# Patient Record
Sex: Female | Born: 1960 | Race: White | Hispanic: No | Marital: Married
Health system: Midwestern US, Academic
[De-identification: ages and names within clinical notes are randomized; demographics above are authoritative.]

---

## 2016-09-19 ENCOUNTER — Ambulatory Visit: Admit: 2016-09-19 | Discharge: 2016-09-20 | Payer: BC Managed Care – HMO

## 2016-09-20 DIAGNOSIS — I472 Ventricular tachycardia: Principal | ICD-10-CM

## 2016-09-20 DIAGNOSIS — Z95810 Presence of automatic (implantable) cardiac defibrillator: ICD-10-CM

## 2016-11-10 LAB — COMPREHENSIVE METABOLIC PANEL
Lab: 0.7
Lab: 12
Lab: 19
Lab: 25
Lab: 3.5
Lab: 59
Lab: 68
Lab: 68
Lab: 7.2 — ABNORMAL HIGH (ref 11.5–14.5)
Lab: 9.7

## 2016-12-20 ENCOUNTER — Ambulatory Visit: Admit: 2016-12-19 | Discharge: 2016-12-20 | Payer: BC Managed Care – HMO

## 2016-12-20 DIAGNOSIS — I422 Other hypertrophic cardiomyopathy: Principal | ICD-10-CM

## 2016-12-20 DIAGNOSIS — I509 Heart failure, unspecified: Secondary | ICD-10-CM

## 2016-12-20 DIAGNOSIS — I472 Ventricular tachycardia: ICD-10-CM

## 2016-12-23 ENCOUNTER — Encounter: Admit: 2016-12-23 | Discharge: 2016-12-23 | Payer: BC Managed Care – HMO

## 2016-12-23 MED ORDER — FUROSEMIDE 20 MG PO TAB
40 mg | ORAL_TABLET | Freq: Two times a day (BID) | ORAL | 2 refills | 90.00000 days | Status: AC
Start: 2016-12-23 — End: 2017-04-22

## 2016-12-25 NOTE — Telephone Encounter
Reviewed with TLR, advised to increase lasix to 60 mg bid for 3 days, then resume lasix at 40 mg bid.  Called pt with recommendations and she verbalizes understanding.  Asked pt to call for increasing sx.  Pt is scheduled for f/u 10/18.

## 2017-01-02 NOTE — Telephone Encounter
LM on voicemail to follow-up on SOB.  Asked pt to call back if she continues to have sob.

## 2017-01-27 ENCOUNTER — Ambulatory Visit: Admit: 2017-01-27 | Discharge: 2017-01-28 | Payer: BC Managed Care – HMO

## 2017-01-27 ENCOUNTER — Encounter: Admit: 2017-01-27 | Discharge: 2017-01-27 | Payer: BC Managed Care – HMO

## 2017-01-27 DIAGNOSIS — I499 Cardiac arrhythmia, unspecified: ICD-10-CM

## 2017-01-27 DIAGNOSIS — G4730 Sleep apnea, unspecified: ICD-10-CM

## 2017-01-27 DIAGNOSIS — R198 Other specified symptoms and signs involving the digestive system and abdomen: ICD-10-CM

## 2017-01-27 DIAGNOSIS — IMO0002 Unspecified mental or behavioral problem: ICD-10-CM

## 2017-01-27 DIAGNOSIS — Z95810 Presence of automatic (implantable) cardiac defibrillator: ICD-10-CM

## 2017-01-27 DIAGNOSIS — I25118 Atherosclerotic heart disease of native coronary artery with other forms of angina pectoris: ICD-10-CM

## 2017-01-27 DIAGNOSIS — R0600 Dyspnea, unspecified: ICD-10-CM

## 2017-01-27 DIAGNOSIS — E669 Obesity, unspecified: ICD-10-CM

## 2017-01-27 DIAGNOSIS — Z9229 Personal history of other drug therapy: ICD-10-CM

## 2017-01-27 DIAGNOSIS — I509 Heart failure, unspecified: ICD-10-CM

## 2017-01-27 DIAGNOSIS — I429 Cardiomyopathy, unspecified: Principal | ICD-10-CM

## 2017-01-27 DIAGNOSIS — I2510 Atherosclerotic heart disease of native coronary artery without angina pectoris: ICD-10-CM

## 2017-01-27 DIAGNOSIS — I219 Acute myocardial infarction, unspecified: ICD-10-CM

## 2017-01-29 ENCOUNTER — Encounter: Admit: 2017-01-29 | Discharge: 2017-01-29 | Payer: BC Managed Care – HMO

## 2017-03-20 ENCOUNTER — Ambulatory Visit: Admit: 2017-03-20 | Discharge: 2017-03-21 | Payer: BC Managed Care – HMO

## 2017-03-21 DIAGNOSIS — I422 Other hypertrophic cardiomyopathy: Principal | ICD-10-CM

## 2017-03-21 DIAGNOSIS — Z95810 Presence of automatic (implantable) cardiac defibrillator: ICD-10-CM

## 2017-03-21 DIAGNOSIS — I472 Ventricular tachycardia: ICD-10-CM

## 2017-04-20 ENCOUNTER — Encounter: Admit: 2017-04-20 | Discharge: 2017-04-20 | Payer: BC Managed Care – HMO

## 2017-04-22 MED ORDER — FUROSEMIDE 20 MG PO TAB
ORAL_TABLET | Freq: Two times a day (BID) | ORAL | 2 refills | 90.00000 days | Status: AC
Start: 2017-04-22 — End: 2017-08-20

## 2017-05-16 ENCOUNTER — Encounter: Admit: 2017-05-16 | Discharge: 2017-05-16 | Payer: BC Managed Care – HMO

## 2017-05-18 ENCOUNTER — Encounter: Admit: 2017-05-18 | Discharge: 2017-05-18 | Payer: BC Managed Care – HMO

## 2017-05-18 ENCOUNTER — Ambulatory Visit: Admit: 2017-05-18 | Discharge: 2017-05-18 | Payer: BC Managed Care – HMO

## 2017-05-18 DIAGNOSIS — I509 Heart failure, unspecified: Principal | ICD-10-CM

## 2017-05-18 DIAGNOSIS — Z95810 Presence of automatic (implantable) cardiac defibrillator: ICD-10-CM

## 2017-05-18 DIAGNOSIS — E669 Obesity, unspecified: ICD-10-CM

## 2017-05-18 DIAGNOSIS — I429 Cardiomyopathy, unspecified: ICD-10-CM

## 2017-05-18 DIAGNOSIS — Z9229 Personal history of other drug therapy: ICD-10-CM

## 2017-05-18 DIAGNOSIS — IMO0002 Unspecified mental or behavioral problem: ICD-10-CM

## 2017-05-18 DIAGNOSIS — I499 Cardiac arrhythmia, unspecified: ICD-10-CM

## 2017-05-18 DIAGNOSIS — R198 Other specified symptoms and signs involving the digestive system and abdomen: ICD-10-CM

## 2017-05-18 DIAGNOSIS — I2510 Atherosclerotic heart disease of native coronary artery without angina pectoris: Secondary | ICD-10-CM

## 2017-05-18 DIAGNOSIS — I219 Acute myocardial infarction, unspecified: ICD-10-CM

## 2017-05-18 DIAGNOSIS — R0600 Dyspnea, unspecified: ICD-10-CM

## 2017-05-18 DIAGNOSIS — G4730 Sleep apnea, unspecified: ICD-10-CM

## 2017-05-18 MED ORDER — XARELTO 20 MG PO TAB
ORAL_TABLET | Freq: Every day | ORAL | 11 refills | 30.00000 days | Status: AC
Start: 2017-05-18 — End: 2018-04-28

## 2017-05-18 MED ORDER — MEXILETINE 150 MG PO CAP
ORAL_CAPSULE | Freq: Three times a day (TID) | 11 refills | Status: AC
Start: 2017-05-18 — End: 2018-04-28

## 2017-06-09 ENCOUNTER — Encounter: Admit: 2017-06-09 | Discharge: 2017-06-09 | Payer: BC Managed Care – HMO

## 2017-06-10 MED ORDER — KLOR-CON M10 10 MEQ PO TBTQ
ORAL_TABLET | Freq: Every day | ORAL | 11 refills | 30.00000 days | Status: AC
Start: 2017-06-10 — End: 2018-04-28

## 2017-06-19 ENCOUNTER — Ambulatory Visit: Admit: 2017-06-19 | Discharge: 2017-06-20 | Payer: BC Managed Care – HMO

## 2017-06-20 ENCOUNTER — Encounter: Admit: 2017-06-20 | Discharge: 2017-06-20 | Payer: BC Managed Care – HMO

## 2017-06-20 DIAGNOSIS — I472 Ventricular tachycardia: ICD-10-CM

## 2017-06-20 DIAGNOSIS — I422 Other hypertrophic cardiomyopathy: Principal | ICD-10-CM

## 2017-06-20 DIAGNOSIS — Z95810 Presence of automatic (implantable) cardiac defibrillator: ICD-10-CM

## 2017-06-22 MED ORDER — METOPROLOL SUCCINATE 100 MG PO TB24
ORAL_TABLET | Freq: Every day | ORAL | 11 refills | 90.00000 days | Status: AC
Start: 2017-06-22 — End: 2018-04-28

## 2017-07-16 ENCOUNTER — Encounter: Admit: 2017-07-16 | Discharge: 2017-07-16 | Payer: BC Managed Care – HMO

## 2017-07-16 DIAGNOSIS — G4730 Sleep apnea, unspecified: ICD-10-CM

## 2017-07-16 DIAGNOSIS — R198 Other specified symptoms and signs involving the digestive system and abdomen: ICD-10-CM

## 2017-07-16 DIAGNOSIS — Z9229 Personal history of other drug therapy: ICD-10-CM

## 2017-07-16 DIAGNOSIS — I509 Heart failure, unspecified: ICD-10-CM

## 2017-07-16 DIAGNOSIS — R0600 Dyspnea, unspecified: ICD-10-CM

## 2017-07-16 DIAGNOSIS — I499 Cardiac arrhythmia, unspecified: ICD-10-CM

## 2017-07-16 DIAGNOSIS — E669 Obesity, unspecified: ICD-10-CM

## 2017-07-16 DIAGNOSIS — I219 Acute myocardial infarction, unspecified: ICD-10-CM

## 2017-07-16 DIAGNOSIS — IMO0002 Unspecified mental or behavioral problem: ICD-10-CM

## 2017-07-16 DIAGNOSIS — I2510 Atherosclerotic heart disease of native coronary artery without angina pectoris: ICD-10-CM

## 2017-07-16 DIAGNOSIS — Z95810 Presence of automatic (implantable) cardiac defibrillator: ICD-10-CM

## 2017-07-21 ENCOUNTER — Encounter: Admit: 2017-07-21 | Discharge: 2017-07-21 | Payer: BC Managed Care – HMO

## 2017-08-11 ENCOUNTER — Ambulatory Visit: Admit: 2017-08-11 | Discharge: 2017-08-12 | Payer: BC Managed Care – HMO

## 2017-08-11 ENCOUNTER — Encounter: Admit: 2017-08-11 | Discharge: 2017-08-11 | Payer: BC Managed Care – HMO

## 2017-08-11 DIAGNOSIS — IMO0002 Unspecified mental or behavioral problem: ICD-10-CM

## 2017-08-11 DIAGNOSIS — I509 Heart failure, unspecified: ICD-10-CM

## 2017-08-11 DIAGNOSIS — E669 Obesity, unspecified: ICD-10-CM

## 2017-08-11 DIAGNOSIS — I219 Acute myocardial infarction, unspecified: ICD-10-CM

## 2017-08-11 DIAGNOSIS — Z9229 Personal history of other drug therapy: ICD-10-CM

## 2017-08-11 DIAGNOSIS — R079 Chest pain, unspecified: ICD-10-CM

## 2017-08-11 DIAGNOSIS — Z95810 Presence of automatic (implantable) cardiac defibrillator: ICD-10-CM

## 2017-08-11 DIAGNOSIS — R198 Other specified symptoms and signs involving the digestive system and abdomen: ICD-10-CM

## 2017-08-11 DIAGNOSIS — I2510 Atherosclerotic heart disease of native coronary artery without angina pectoris: ICD-10-CM

## 2017-08-11 DIAGNOSIS — I499 Cardiac arrhythmia, unspecified: ICD-10-CM

## 2017-08-11 DIAGNOSIS — R0600 Dyspnea, unspecified: ICD-10-CM

## 2017-08-11 DIAGNOSIS — G4730 Sleep apnea, unspecified: ICD-10-CM

## 2017-08-11 DIAGNOSIS — I429 Cardiomyopathy, unspecified: Principal | ICD-10-CM

## 2017-08-12 ENCOUNTER — Encounter: Admit: 2017-08-12 | Discharge: 2017-08-12 | Payer: BC Managed Care – HMO

## 2017-08-19 ENCOUNTER — Encounter: Admit: 2017-08-19 | Discharge: 2017-08-19 | Payer: BC Managed Care – HMO

## 2017-08-20 MED ORDER — FUROSEMIDE 20 MG PO TAB
ORAL_TABLET | Freq: Two times a day (BID) | ORAL | 2 refills | 90.00000 days | Status: AC
Start: 2017-08-20 — End: 2017-12-28

## 2017-08-25 LAB — BASIC METABOLIC PANEL
Lab: 0.9
Lab: 119 — ABNORMAL HIGH (ref 70–105)
Lab: 140 mL
Lab: 18
Lab: 24
Lab: 3.6
Lab: 9.6

## 2017-08-26 ENCOUNTER — Encounter: Admit: 2017-08-26 | Discharge: 2017-08-26 | Payer: BC Managed Care – HMO

## 2017-08-26 DIAGNOSIS — I429 Cardiomyopathy, unspecified: Principal | ICD-10-CM

## 2017-09-16 ENCOUNTER — Encounter: Admit: 2017-09-16 | Discharge: 2017-09-16 | Payer: BC Managed Care – HMO

## 2017-09-21 ENCOUNTER — Ambulatory Visit: Admit: 2017-09-21 | Discharge: 2017-09-22 | Payer: BC Managed Care – HMO

## 2017-09-21 DIAGNOSIS — Z95810 Presence of automatic (implantable) cardiac defibrillator: Secondary | ICD-10-CM

## 2017-09-22 DIAGNOSIS — I472 Ventricular tachycardia: Principal | ICD-10-CM

## 2017-09-22 DIAGNOSIS — I422 Other hypertrophic cardiomyopathy: ICD-10-CM

## 2017-11-06 ENCOUNTER — Encounter: Admit: 2017-11-06 | Discharge: 2017-11-06 | Payer: BC Managed Care – HMO

## 2017-11-18 LAB — COMPREHENSIVE METABOLIC PANEL
Lab: 0.9 — ABNORMAL LOW (ref 33.0–37.0)
Lab: 1
Lab: 106 — ABNORMAL HIGH (ref 37.0–47.0)
Lab: 13
Lab: 140
Lab: 15 — ABNORMAL HIGH (ref 27.0–31.0)
Lab: 20
Lab: 25
Lab: 29
Lab: 3.9
Lab: 4.1
Lab: 66
Lab: 7.8
Lab: 76
Lab: 9.5
Lab: 95

## 2017-11-18 LAB — CBC: Lab: 9.2

## 2017-12-21 ENCOUNTER — Ambulatory Visit: Admit: 2017-12-21 | Discharge: 2017-12-22 | Payer: BC Managed Care – HMO

## 2017-12-22 DIAGNOSIS — Z95810 Presence of automatic (implantable) cardiac defibrillator: Secondary | ICD-10-CM

## 2017-12-22 DIAGNOSIS — I472 Ventricular tachycardia: Principal | ICD-10-CM

## 2017-12-28 ENCOUNTER — Encounter: Admit: 2017-12-28 | Discharge: 2017-12-28 | Payer: BC Managed Care – HMO

## 2017-12-28 MED ORDER — FUROSEMIDE 20 MG PO TAB
ORAL_TABLET | Freq: Two times a day (BID) | ORAL | 5 refills | 90.00000 days | Status: AC
Start: 2017-12-28 — End: 2018-04-28

## 2018-01-28 ENCOUNTER — Encounter: Admit: 2018-01-28 | Discharge: 2018-01-28 | Payer: BC Managed Care – HMO

## 2018-01-28 ENCOUNTER — Ambulatory Visit: Admit: 2018-01-28 | Discharge: 2018-01-29 | Payer: BC Managed Care – HMO

## 2018-01-28 DIAGNOSIS — IMO0002 Unspecified mental or behavioral problem: ICD-10-CM

## 2018-01-28 DIAGNOSIS — I429 Cardiomyopathy, unspecified: Principal | ICD-10-CM

## 2018-01-28 DIAGNOSIS — I2510 Atherosclerotic heart disease of native coronary artery without angina pectoris: Principal | ICD-10-CM

## 2018-01-28 DIAGNOSIS — E669 Obesity, unspecified: ICD-10-CM

## 2018-01-28 DIAGNOSIS — R198 Other specified symptoms and signs involving the digestive system and abdomen: ICD-10-CM

## 2018-01-28 DIAGNOSIS — I509 Heart failure, unspecified: ICD-10-CM

## 2018-01-28 DIAGNOSIS — R0600 Dyspnea, unspecified: ICD-10-CM

## 2018-01-28 DIAGNOSIS — Z95810 Presence of automatic (implantable) cardiac defibrillator: ICD-10-CM

## 2018-01-28 DIAGNOSIS — G4730 Sleep apnea, unspecified: ICD-10-CM

## 2018-01-28 DIAGNOSIS — Z9229 Personal history of other drug therapy: ICD-10-CM

## 2018-01-28 DIAGNOSIS — I219 Acute myocardial infarction, unspecified: ICD-10-CM

## 2018-01-28 DIAGNOSIS — I499 Cardiac arrhythmia, unspecified: ICD-10-CM

## 2018-01-28 DIAGNOSIS — R079 Chest pain, unspecified: ICD-10-CM

## 2018-02-23 ENCOUNTER — Ambulatory Visit: Admit: 2018-02-23 | Discharge: 2018-02-24 | Payer: BC Managed Care – HMO

## 2018-02-23 DIAGNOSIS — R079 Chest pain, unspecified: ICD-10-CM

## 2018-02-23 DIAGNOSIS — I429 Cardiomyopathy, unspecified: Principal | ICD-10-CM

## 2018-02-24 ENCOUNTER — Encounter: Admit: 2018-02-24 | Discharge: 2018-02-24 | Payer: BC Managed Care – HMO

## 2018-02-24 DIAGNOSIS — Z95810 Presence of automatic (implantable) cardiac defibrillator: ICD-10-CM

## 2018-02-24 DIAGNOSIS — I509 Heart failure, unspecified: Principal | ICD-10-CM

## 2018-03-22 ENCOUNTER — Ambulatory Visit: Admit: 2018-03-22 | Discharge: 2018-03-23 | Payer: BC Managed Care – HMO

## 2018-03-23 DIAGNOSIS — I422 Other hypertrophic cardiomyopathy: Principal | ICD-10-CM

## 2018-03-23 DIAGNOSIS — I472 Ventricular tachycardia: ICD-10-CM

## 2018-03-23 DIAGNOSIS — Z95810 Presence of automatic (implantable) cardiac defibrillator: Secondary | ICD-10-CM

## 2018-04-21 ENCOUNTER — Encounter: Admit: 2018-04-21 | Discharge: 2018-04-21 | Payer: BC Managed Care – HMO

## 2018-04-21 ENCOUNTER — Encounter: Admit: 2018-04-21 | Discharge: 2018-04-22 | Payer: BC Managed Care – HMO

## 2018-04-21 DIAGNOSIS — I619 Nontraumatic intracerebral hemorrhage, unspecified: ICD-10-CM

## 2018-04-21 MED ORDER — FENTANYL CITRATE (PF) 50 MCG/ML IJ SOLN
25-50 ug | INTRAVENOUS | 0 refills | Status: DC | PRN
Start: 2018-04-21 — End: 2018-04-25
  Administered 2018-04-22: 21:00:00 25 ug via INTRAVENOUS

## 2018-04-21 MED ORDER — HYDRALAZINE 20 MG/ML IJ SOLN
10 mg | INTRAVENOUS | 0 refills | Status: DC | PRN
Start: 2018-04-21 — End: 2018-04-22

## 2018-04-21 MED ORDER — SENNOSIDES-DOCUSATE SODIUM 8.6-50 MG PO TAB
1 | Freq: Two times a day (BID) | ORAL | 0 refills | Status: DC
Start: 2018-04-21 — End: 2018-04-28
  Administered 2018-04-22 – 2018-04-28 (×9): 1 via ORAL

## 2018-04-21 MED ORDER — LABETALOL 5 MG/ML IV SYRG
10-20 mg | INTRAVENOUS | 0 refills | Status: DC | PRN
Start: 2018-04-21 — End: 2018-04-24

## 2018-04-21 MED ORDER — FAMOTIDINE (PF) 20 MG/2 ML IV SOLN
20 mg | Freq: Two times a day (BID) | INTRAVENOUS | 0 refills | Status: DC
Start: 2018-04-21 — End: 2018-04-22
  Administered 2018-04-22 (×2): 20 mg via INTRAVENOUS

## 2018-04-21 MED ORDER — CHLORHEXIDINE GLUCONATE 0.12 % MM MWSH
15 mL | Freq: Two times a day (BID) | 0 refills | Status: DC
Start: 2018-04-21 — End: 2018-04-24
  Administered 2018-04-22 – 2018-04-24 (×6): 15 mL

## 2018-04-21 MED ORDER — MAGNESIUM HYDROXIDE 2,400 MG/10 ML PO SUSP
10 mL | Freq: Every day | ORAL | 0 refills | Status: DC
Start: 2018-04-21 — End: 2018-04-23
  Administered 2018-04-22: 16:00:00 10 mL via ORAL

## 2018-04-21 MED ORDER — SODIUM CHLORIDE 0.9 % IV SOLP
INTRAVENOUS | 0 refills | Status: DC
Start: 2018-04-21 — End: 2018-04-22
  Administered 2018-04-22: 05:00:00 1000.000 mL via INTRAVENOUS

## 2018-04-21 MED ORDER — DOCUSATE SODIUM 100 MG PO CAP
100 mg | Freq: Two times a day (BID) | ORAL | 0 refills | Status: DC
Start: 2018-04-21 — End: 2018-04-23

## 2018-04-22 ENCOUNTER — Encounter: Admit: 2018-04-22 | Discharge: 2018-04-22 | Payer: BC Managed Care – HMO

## 2018-04-22 DIAGNOSIS — R198 Other specified symptoms and signs involving the digestive system and abdomen: Secondary | ICD-10-CM

## 2018-04-22 DIAGNOSIS — R0600 Dyspnea, unspecified: Secondary | ICD-10-CM

## 2018-04-22 DIAGNOSIS — I509 Heart failure, unspecified: Secondary | ICD-10-CM

## 2018-04-22 DIAGNOSIS — I499 Cardiac arrhythmia, unspecified: Secondary | ICD-10-CM

## 2018-04-22 DIAGNOSIS — IMO0002 Unspecified mental or behavioral problem: Secondary | ICD-10-CM

## 2018-04-22 DIAGNOSIS — I219 Acute myocardial infarction, unspecified: Secondary | ICD-10-CM

## 2018-04-22 DIAGNOSIS — E669 Obesity, unspecified: Secondary | ICD-10-CM

## 2018-04-22 DIAGNOSIS — I2510 Atherosclerotic heart disease of native coronary artery without angina pectoris: Secondary | ICD-10-CM

## 2018-04-22 DIAGNOSIS — G4730 Sleep apnea, unspecified: Secondary | ICD-10-CM

## 2018-04-22 DIAGNOSIS — Z9229 Personal history of other drug therapy: Secondary | ICD-10-CM

## 2018-04-22 DIAGNOSIS — Z95810 Presence of automatic (implantable) cardiac defibrillator: Secondary | ICD-10-CM

## 2018-04-22 LAB — PHOSPHORUS
Lab: 4.3 mg/dL (ref 2.0–4.5)
Lab: 3.6 mg/dL — ABNORMAL LOW (ref >60)

## 2018-04-22 LAB — MAGNESIUM
Lab: 2.2 mg/dL (ref 1.6–2.6)
Lab: 2.2 mg/dL — ABNORMAL LOW (ref 1.6–2.6)

## 2018-04-22 LAB — PROTIME INR (PT): Lab: 1.3 M/UL — ABNORMAL HIGH (ref 0.8–1.2)

## 2018-04-22 LAB — COMPREHENSIVE METABOLIC PANEL
Lab: 0.9 mg/dL (ref 0.4–1.00)
Lab: 1.9 mg/dL — ABNORMAL HIGH (ref 0.3–1.2)
Lab: 134 mg/dL — ABNORMAL HIGH (ref 70–100)
Lab: 14 K/UL — ABNORMAL HIGH (ref 3–12)
Lab: 144 MMOL/L — ABNORMAL HIGH (ref 137–147)
Lab: 19 U/L (ref 7–56)
Lab: 21 MMOL/L — ABNORMAL HIGH (ref 21–30)
Lab: 23 mg/dL — ABNORMAL HIGH (ref 7–25)
Lab: 4.2 g/dL (ref 3.5–5.0)
Lab: 59 U/L — ABNORMAL HIGH (ref 7–40)
Lab: 71 U/L (ref 25–110)
Lab: 8.1 g/dL — ABNORMAL HIGH (ref 6.0–8.0)
Lab: >60 mL/min (ref >60)
Lab: >60 mL/min (ref >60)

## 2018-04-22 LAB — CBC AND DIFF
Lab: 22 10*3/uL — ABNORMAL HIGH (ref 4.5–11.0)
Lab: 22 K/UL — ABNORMAL HIGH (ref 4.5–11.0)

## 2018-04-22 LAB — PTT (APTT): Lab: 28 s — ABNORMAL HIGH (ref 24.0–36.5)

## 2018-04-22 LAB — POC BLOOD GAS ARTERIAL
Lab: 1 MMOL/L
Lab: 118 mmHg — ABNORMAL HIGH (ref 80–100)
Lab: 21 MMOL/L (ref 21–28)
Lab: 27 mmHg — ABNORMAL LOW (ref 35–45)
Lab: 7.5 — ABNORMAL HIGH (ref 7.35–7.45)
Lab: 99 % (ref 95–99)

## 2018-04-22 LAB — TROPONIN-I
Lab: 0 ng/mL — ABNORMAL HIGH (ref 0.0–0.05)
Lab: 0 ng/mL — ABNORMAL HIGH (ref 0.0–0.05)
Lab: 0 ng/mL — ABNORMAL HIGH (ref 0.0–0.05)

## 2018-04-22 LAB — CREATINE KINASE-CPK
Lab: 766 U/L — ABNORMAL HIGH (ref 21–215)
Lab: 618 U/L — ABNORMAL HIGH (ref 21–215)

## 2018-04-22 LAB — BASIC METABOLIC PANEL: Lab: 145 MMOL/L — ABNORMAL HIGH (ref 137–147)

## 2018-04-22 LAB — BLOOD GASES, ARTERIAL: Lab: 7.4 mg/dL — ABNORMAL HIGH (ref <150)

## 2018-04-22 LAB — IONIZED CALCIUM: Lab: 1 MMOL/L — ABNORMAL LOW (ref >60)

## 2018-04-22 LAB — LACTIC ACID(LACTATE)
Lab: 2.4 MMOL/L — ABNORMAL HIGH (ref 0.5–2.0)
Lab: 1.8 MMOL/L (ref 0.5–2.0)

## 2018-04-22 MED ORDER — MEXILETINE(#) 10 MG/ML PO SOLN
150 mg | Freq: Three times a day (TID) | OROGASTRIC | 0 refills | Status: DC
Start: 2018-04-22 — End: 2018-04-22

## 2018-04-22 MED ORDER — PANTOPRAZOLE 40 MG IV SOLR
40 mg | Freq: Every day | INTRAVENOUS | 0 refills | Status: DC
Start: 2018-04-22 — End: 2018-04-23
  Administered 2018-04-23: 03:00:00 40 mg via INTRAVENOUS

## 2018-04-22 MED ORDER — HUM PROTHROMBIN CPLX(PCC)4FACT 500 UNIT (400-620 UNIT) IV SOLR
25 [IU]/kg | Freq: Once | INTRAVENOUS | 0 refills | Status: DC
Start: 2018-04-22 — End: 2018-04-22

## 2018-04-22 MED ORDER — POTASSIUM CHLORIDE 20 MEQ/15 ML PO LIQD
40-60 meq | NASOGASTRIC | 0 refills | Status: DC | PRN
Start: 2018-04-22 — End: 2018-04-28
  Administered 2018-04-24 – 2018-04-28 (×3): 40 meq via NASOGASTRIC

## 2018-04-22 MED ORDER — IPRATROPIUM BROMIDE 0.02 % IN SOLN
.5 mg | RESPIRATORY_TRACT | 0 refills | Status: DC | PRN
Start: 2018-04-22 — End: 2018-04-28
  Administered 2018-04-22 – 2018-04-28 (×36): 0.5 mg via RESPIRATORY_TRACT

## 2018-04-22 MED ORDER — METOPROLOL TARTRATE 25 MG PO TAB
25 mg | Freq: Two times a day (BID) | OROGASTRIC | 0 refills | Status: DC
Start: 2018-04-22 — End: 2018-04-28
  Administered 2018-04-23 – 2018-04-28 (×11): 25 mg via OROGASTRIC

## 2018-04-22 MED ORDER — SODIUM CHLORIDE 0.9 % IV SOLP
250 mL | INTRAVENOUS | 0 refills | Status: CP
Start: 2018-04-22 — End: ?
  Administered 2018-04-22: 09:00:00 250 mL via INTRAVENOUS

## 2018-04-22 MED ORDER — MAGNESIUM SULFATE IN D5W 1 GRAM/100 ML IV PGBK
1 g | INTRAVENOUS | 0 refills | Status: DC | PRN
Start: 2018-04-22 — End: 2018-04-26

## 2018-04-22 MED ORDER — ALBUTEROL SULFATE 2.5 MG/0.5 ML IN NEBU
2.5 mg | RESPIRATORY_TRACT | 0 refills | Status: DC | PRN
Start: 2018-04-22 — End: 2018-04-28
  Administered 2018-04-22 – 2018-04-28 (×35): 2.5 mg via RESPIRATORY_TRACT

## 2018-04-22 MED ORDER — SODIUM PHOSPHATE IVPB
8 MMOL | INTRAVENOUS | 0 refills | Status: DC | PRN
Start: 2018-04-22 — End: 2018-04-26

## 2018-04-22 MED ORDER — HYDRALAZINE 20 MG/ML IJ SOLN
10 mg | INTRAVENOUS | 0 refills | Status: DC | PRN
Start: 2018-04-22 — End: 2018-04-22

## 2018-04-22 MED ORDER — MEXILETINE 150 MG PO CAP
150 mg | NASOGASTRIC | 0 refills | Status: DC
Start: 2018-04-22 — End: 2018-04-23
  Administered 2018-04-22 – 2018-04-23 (×3): 150 mg via NASOGASTRIC

## 2018-04-22 MED ORDER — HYDRALAZINE 20 MG/ML IJ SOLN
10-20 mg | INTRAVENOUS | 0 refills | Status: DC | PRN
Start: 2018-04-22 — End: 2018-04-22

## 2018-04-22 MED ORDER — POTASSIUM CHLORIDE 20 MEQ PO TBTQ
40-60 meq | ORAL | 0 refills | Status: DC | PRN
Start: 2018-04-22 — End: 2018-04-28

## 2018-04-23 ENCOUNTER — Encounter: Admit: 2018-04-23 | Discharge: 2018-04-23 | Payer: BC Managed Care – HMO

## 2018-04-23 ENCOUNTER — Inpatient Hospital Stay: Admit: 2018-04-23 | Discharge: 2018-04-23 | Payer: BC Managed Care – HMO

## 2018-04-23 ENCOUNTER — Encounter: Admit: 2018-04-23 | Discharge: 2018-04-24 | Payer: BC Managed Care – HMO | Attending: Neurology | Admitting: Neurology

## 2018-04-23 LAB — MAGNESIUM: Lab: 2.4 mg/dL — ABNORMAL HIGH (ref 1.6–2.6)

## 2018-04-23 LAB — URINALYSIS DIPSTICK REFLEX TO CULTURE
Lab: NEGATIVE
Lab: NEGATIVE
Lab: NEGATIVE

## 2018-04-23 LAB — TROPONIN-I: Lab: 0 ng/mL (ref 0.0–0.05)

## 2018-04-23 LAB — BLOOD GASES, ARTERIAL: Lab: 7.4 mg/dL — ABNORMAL HIGH (ref >60)

## 2018-04-23 LAB — CREATINE KINASE-CPK: Lab: 308 U/L — ABNORMAL HIGH (ref 21–215)

## 2018-04-23 LAB — URINALYSIS MICROSCOPIC REFLEX TO CULTURE

## 2018-04-23 LAB — CBC AND DIFF
Lab: 16 K/UL — ABNORMAL HIGH (ref >60)
Lab: 4.7 M/UL (ref >60)

## 2018-04-23 LAB — PHOSPHORUS: Lab: 3.4 mg/dL — ABNORMAL LOW (ref 2.0–4.5)

## 2018-04-23 LAB — BASIC METABOLIC PANEL
Lab: 113 MMOL/L — ABNORMAL HIGH (ref 98–110)
Lab: 147 MMOL/L — ABNORMAL LOW (ref 137–147)

## 2018-04-23 LAB — IONIZED CALCIUM: Lab: 1.1 MMOL/L — ABNORMAL LOW (ref 1.0–1.3)

## 2018-04-23 MED ORDER — PANTOPRAZOLE(#) 2MG/ML PO SUSP
40 mg | Freq: Every evening | NASOGASTRIC | 0 refills | Status: DC
Start: 2018-04-23 — End: 2018-04-23

## 2018-04-23 MED ORDER — MAGNESIUM HYDROXIDE 2,400 MG/10 ML PO SUSP
10 mL | Freq: Every day | OROGASTRIC | 0 refills | Status: DC
Start: 2018-04-23 — End: 2018-04-28
  Administered 2018-04-23 – 2018-04-28 (×5): 10 mL via OROGASTRIC

## 2018-04-23 MED ORDER — PANCRELIPASE-SODIUM BICARBONATE 20,880 K UNIT-650 MG
NASOGASTRIC | 0 refills | Status: DC | PRN
Start: 2018-04-23 — End: 2018-04-28

## 2018-04-23 MED ORDER — PANTOPRAZOLE(#) 2MG/ML PO SUSP
40 mg | Freq: Every evening | OROGASTRIC | 0 refills | Status: DC
Start: 2018-04-23 — End: 2018-04-28
  Administered 2018-04-24 – 2018-04-28 (×5): 40 mg via OROGASTRIC

## 2018-04-23 MED ORDER — DOCUSATE SODIUM 50 MG/5 ML PO LIQD
100 mg | Freq: Two times a day (BID) | OROGASTRIC | 0 refills | Status: DC
Start: 2018-04-23 — End: 2018-04-28
  Administered 2018-04-23 – 2018-04-28 (×8): 100 mg via OROGASTRIC

## 2018-04-23 MED ORDER — CEFTRIAXONE INJ 1GM IVP
1 g | INTRAVENOUS | 0 refills | Status: DC
Start: 2018-04-23 — End: 2018-04-28
  Administered 2018-04-23 – 2018-04-27 (×5): 1 g via INTRAVENOUS

## 2018-04-23 MED ORDER — PERFLUTREN LIPID MICROSPHERES 1.1 MG/ML IV SUSP
1-20 mL | Freq: Once | INTRAVENOUS | 0 refills | Status: CP | PRN
Start: 2018-04-23 — End: ?
  Administered 2018-04-23: 18:00:00 2 mL via INTRAVENOUS

## 2018-04-23 MED ORDER — MEXILETINE 150 MG PO CAP
150 mg | OROGASTRIC | 0 refills | Status: DC
Start: 2018-04-23 — End: 2018-04-28
  Administered 2018-04-23 – 2018-04-28 (×15): 150 mg via OROGASTRIC

## 2018-04-24 ENCOUNTER — Inpatient Hospital Stay: Admit: 2018-04-24 | Discharge: 2018-04-24 | Payer: BC Managed Care – HMO

## 2018-04-24 LAB — AMPHETAMINES-URINE RANDOM: Lab: NEGATIVE

## 2018-04-24 LAB — LIPID PROFILE
Lab: 111 mg/dL (ref <150)
Lab: 115 mg/dL — ABNORMAL HIGH (ref <100)
Lab: 130 mg/dL
Lab: 172 mg/dL (ref <200)

## 2018-04-24 LAB — HEMOGLOBIN A1C: Lab: 5.5 % (ref 4.0–6.0)

## 2018-04-24 LAB — MAGNESIUM: Lab: 2.4 mg/dL — ABNORMAL LOW (ref 1.6–2.6)

## 2018-04-24 LAB — CBC AND DIFF: Lab: 14 K/UL — ABNORMAL HIGH (ref >60)

## 2018-04-24 LAB — IONIZED CALCIUM: Lab: 1.1 MMOL/L — ABNORMAL LOW (ref >60)

## 2018-04-24 LAB — OPIATES-URINE RANDOM: Lab: NEGATIVE

## 2018-04-24 LAB — BLOOD GASES, ARTERIAL: Lab: 7.5 % — ABNORMAL HIGH (ref 7.35–7.45)

## 2018-04-24 LAB — BARBITURATES-URINE RANDOM: Lab: NEGATIVE

## 2018-04-24 LAB — PHENCYCLIDINES-URINE RANDOM: Lab: NEGATIVE

## 2018-04-24 LAB — POTASSIUM: Lab: 4.4 MMOL/L (ref 3.5–5.1)

## 2018-04-24 LAB — BASIC METABOLIC PANEL: Lab: 150 MMOL/L — ABNORMAL HIGH (ref >60)

## 2018-04-24 LAB — BENZODIAZEPINES-URINE RANDOM: Lab: POSITIVE — AB

## 2018-04-24 LAB — COCAINE-URINE RANDOM: Lab: NEGATIVE

## 2018-04-24 LAB — PHOSPHORUS: Lab: 2.4 mg/dL (ref 2.0–4.5)

## 2018-04-24 LAB — CANNABINOIDS-URINE RANDOM: Lab: NEGATIVE

## 2018-04-24 MED ORDER — HEPARIN, PORCINE (PF) 5,000 UNIT/0.5 ML IJ SYRG
5000 [IU] | SUBCUTANEOUS | 0 refills | Status: DC
Start: 2018-04-24 — End: 2018-04-28
  Administered 2018-04-24 – 2018-04-28 (×12): 5000 [IU] via SUBCUTANEOUS

## 2018-04-24 MED ORDER — FUROSEMIDE 10 MG/ML PO SOLN
20 mg | Freq: Every day | NASOGASTRIC | 0 refills | Status: DC
Start: 2018-04-24 — End: 2018-04-26
  Administered 2018-04-24 – 2018-04-26 (×3): 20 mg via NASOGASTRIC

## 2018-04-24 MED ORDER — LABETALOL 5 MG/ML IV SYRG
10-20 mg | INTRAVENOUS | 0 refills | Status: DC | PRN
Start: 2018-04-24 — End: 2018-04-28
  Administered 2018-04-25: 11:00:00 10 mg via INTRAVENOUS

## 2018-04-25 LAB — PHOSPHORUS: Lab: 3.1 mg/dL — ABNORMAL HIGH (ref >60)

## 2018-04-25 LAB — IONIZED CALCIUM: Lab: 1.1 MMOL/L (ref 1.0–1.3)

## 2018-04-25 LAB — CBC AND DIFF: Lab: 13 K/UL — ABNORMAL HIGH (ref 4.5–11.0)

## 2018-04-25 LAB — MAGNESIUM: Lab: 2.3 mg/dL — ABNORMAL LOW (ref >60)

## 2018-04-25 LAB — CULTURE-URINE W/SENSITIVITY
Lab: >10 mL/min — AB (ref >60)
Lab: >10

## 2018-04-25 LAB — BLOOD GASES, ARTERIAL: Lab: 7.5 mg/dL — ABNORMAL HIGH (ref 7.35–7.45)

## 2018-04-25 LAB — BASIC METABOLIC PANEL: Lab: 151 MMOL/L — ABNORMAL HIGH (ref 137–147)

## 2018-04-25 MED ORDER — ATORVASTATIN 40 MG PO TAB
40 mg | Freq: Every day | ORAL | 0 refills | Status: DC
Start: 2018-04-25 — End: 2018-04-28
  Administered 2018-04-25 – 2018-04-28 (×4): 40 mg via ORAL

## 2018-04-25 MED ORDER — SODIUM PHOSPHATE IVPB
8 MMOL | INTRAVENOUS | 0 refills | Status: DC | PRN
Start: 2018-04-25 — End: 2018-04-28

## 2018-04-25 MED ORDER — MAGNESIUM SULFATE IN D5W 1 GRAM/100 ML IV PGBK
1 g | INTRAVENOUS | 0 refills | Status: DC | PRN
Start: 2018-04-25 — End: 2018-04-28

## 2018-04-25 MED ORDER — ACETAMINOPHEN 160 MG/5 ML PO SOLN
650 mg | ORAL | 0 refills | Status: DC | PRN
Start: 2018-04-25 — End: 2018-04-28
  Administered 2018-04-25: 20:00:00 650 mg via ORAL

## 2018-04-25 MED ORDER — BISACODYL 10 MG RE SUPP
10 mg | Freq: Once | RECTAL | 0 refills | Status: CP
Start: 2018-04-25 — End: ?
  Administered 2018-04-25: 18:00:00 10 mg via RECTAL

## 2018-04-26 ENCOUNTER — Encounter: Admit: 2018-04-26 | Discharge: 2018-04-26 | Payer: BC Managed Care – HMO

## 2018-04-26 LAB — IONIZED CALCIUM: Lab: 1.2 MMOL/L — ABNORMAL HIGH (ref >60)

## 2018-04-26 LAB — BLOOD GASES, ARTERIAL: Lab: 7.5 mg/dL — ABNORMAL HIGH (ref >60)

## 2018-04-26 LAB — BASIC METABOLIC PANEL: Lab: 151 MMOL/L — ABNORMAL HIGH (ref >60)

## 2018-04-26 LAB — MAGNESIUM: Lab: 2.6 mg/dL — ABNORMAL HIGH (ref >60)

## 2018-04-26 LAB — CBC AND DIFF: Lab: 12 K/UL — ABNORMAL HIGH (ref 4.5–11.0)

## 2018-04-26 LAB — PHOSPHORUS: Lab: 4 mg/dL (ref >60)

## 2018-04-26 MED ORDER — FUROSEMIDE 10 MG/ML PO SOLN
20 mg | Freq: Two times a day (BID) | NASOGASTRIC | 0 refills | Status: DC
Start: 2018-04-26 — End: 2018-04-28
  Administered 2018-04-27 – 2018-04-28 (×4): 20 mg via NASOGASTRIC

## 2018-04-27 LAB — BLOOD GASES, ARTERIAL
Lab: 1.4 MMOL/L
Lab: 120 mmHg — ABNORMAL HIGH (ref 80–100)
Lab: 25 MMOL/L (ref 21–28)
Lab: 29 mmHg — ABNORMAL LOW (ref 35–45)
Lab: 7.5 — ABNORMAL HIGH (ref 7.35–7.45)
Lab: 99 % — ABNORMAL HIGH (ref 95–99)

## 2018-04-27 LAB — MAGNESIUM: Lab: 2.3 mg/dL — ABNORMAL HIGH (ref 1.6–2.6)

## 2018-04-27 LAB — PHOSPHORUS: Lab: 4 mg/dL — ABNORMAL LOW (ref >60)

## 2018-04-27 LAB — CBC AND DIFF: Lab: 11 K/UL — ABNORMAL HIGH (ref 4.5–11.0)

## 2018-04-27 LAB — SODIUM: Lab: 149 MMOL/L — ABNORMAL HIGH (ref 137–147)

## 2018-04-27 LAB — BASIC METABOLIC PANEL: Lab: 149 MMOL/L — ABNORMAL HIGH (ref >60)

## 2018-04-27 LAB — IONIZED CALCIUM: Lab: 1.2 MMOL/L (ref >60)

## 2018-04-27 MED ORDER — FUROSEMIDE 10 MG/ML IJ SOLN
40 mg | Freq: Once | INTRAVENOUS | 0 refills | Status: CP
Start: 2018-04-27 — End: ?
  Administered 2018-04-27: 23:00:00 40 mg via INTRAVENOUS

## 2018-04-28 ENCOUNTER — Inpatient Hospital Stay: Admit: 2018-04-26 | Discharge: 2018-04-26 | Payer: BC Managed Care – HMO

## 2018-04-28 ENCOUNTER — Inpatient Hospital Stay: Admit: 2018-04-24 | Discharge: 2018-04-24 | Payer: BC Managed Care – HMO

## 2018-04-28 ENCOUNTER — Inpatient Hospital Stay: Admit: 2018-04-21 | Discharge: 2018-04-21 | Payer: BC Managed Care – HMO

## 2018-04-28 ENCOUNTER — Ambulatory Visit
Admit: 2018-04-22 | Discharge: 2018-04-28 | Disposition: A | Discharge status: Hospice | Payer: BC Managed Care – HMO | Source: Other Acute Inpatient Hospital | Attending: Neurology | Admitting: Neurology

## 2018-04-28 ENCOUNTER — Inpatient Hospital Stay: Admit: 2018-04-23 | Discharge: 2018-04-23 | Payer: BC Managed Care – HMO

## 2018-04-28 ENCOUNTER — Inpatient Hospital Stay: Admit: 2018-04-28 | Discharge: 2018-04-29 | Payer: BC Managed Care – HMO | Admitting: Neurology

## 2018-04-28 ENCOUNTER — Inpatient Hospital Stay: Admit: 2018-04-22 | Discharge: 2018-04-22 | Payer: BC Managed Care – HMO

## 2018-04-28 DIAGNOSIS — Z515 Encounter for palliative care: Secondary | ICD-10-CM

## 2018-04-28 DIAGNOSIS — Z87891 Personal history of nicotine dependence: Secondary | ICD-10-CM

## 2018-04-28 DIAGNOSIS — F329 Major depressive disorder, single episode, unspecified: Secondary | ICD-10-CM

## 2018-04-28 DIAGNOSIS — R402124 Coma scale, eyes open, to pain, 24 hours or more after hospital admission: Secondary | ICD-10-CM

## 2018-04-28 DIAGNOSIS — M069 Rheumatoid arthritis, unspecified: Secondary | ICD-10-CM

## 2018-04-28 DIAGNOSIS — I110 Hypertensive heart disease with heart failure: Secondary | ICD-10-CM

## 2018-04-28 DIAGNOSIS — I25119 Atherosclerotic heart disease of native coronary artery with unspecified angina pectoris: Secondary | ICD-10-CM

## 2018-04-28 DIAGNOSIS — I252 Old myocardial infarction: Secondary | ICD-10-CM

## 2018-04-28 DIAGNOSIS — Z6836 Body mass index (BMI) 36.0-36.9, adult: Secondary | ICD-10-CM

## 2018-04-28 DIAGNOSIS — J9600 Acute respiratory failure, unspecified whether with hypoxia or hypercapnia: Secondary | ICD-10-CM

## 2018-04-28 DIAGNOSIS — I615 Nontraumatic intracerebral hemorrhage, intraventricular: Secondary | ICD-10-CM

## 2018-04-28 DIAGNOSIS — Z95810 Presence of automatic (implantable) cardiac defibrillator: Secondary | ICD-10-CM

## 2018-04-28 DIAGNOSIS — Z7901 Long term (current) use of anticoagulants: Secondary | ICD-10-CM

## 2018-04-28 DIAGNOSIS — I509 Heart failure, unspecified: Secondary | ICD-10-CM

## 2018-04-28 DIAGNOSIS — Z66 Do not resuscitate: Secondary | ICD-10-CM

## 2018-04-28 DIAGNOSIS — G936 Cerebral edema: Secondary | ICD-10-CM

## 2018-04-28 DIAGNOSIS — I351 Nonrheumatic aortic (valve) insufficiency: Secondary | ICD-10-CM

## 2018-04-28 DIAGNOSIS — N390 Urinary tract infection, site not specified: Secondary | ICD-10-CM

## 2018-04-28 DIAGNOSIS — E669 Obesity, unspecified: Secondary | ICD-10-CM

## 2018-04-28 DIAGNOSIS — G4733 Obstructive sleep apnea (adult) (pediatric): Secondary | ICD-10-CM

## 2018-04-28 DIAGNOSIS — I422 Other hypertrophic cardiomyopathy: Secondary | ICD-10-CM

## 2018-04-28 DIAGNOSIS — G8191 Hemiplegia, unspecified affecting right dominant side: Secondary | ICD-10-CM

## 2018-04-28 DIAGNOSIS — I255 Ischemic cardiomyopathy: Secondary | ICD-10-CM

## 2018-04-28 DIAGNOSIS — R402354 Coma scale, best motor response, localizes pain, 24 hours or more after hospital admission: Secondary | ICD-10-CM

## 2018-04-28 DIAGNOSIS — E782 Mixed hyperlipidemia: Secondary | ICD-10-CM

## 2018-04-28 DIAGNOSIS — R402214 Coma scale, best verbal response, none, 24 hours or more after hospital admission: Secondary | ICD-10-CM

## 2018-04-28 DIAGNOSIS — I63312 Cerebral infarction due to thrombosis of left middle cerebral artery: Secondary | ICD-10-CM

## 2018-04-28 LAB — CBC AND DIFF
Lab: 13 10*3/uL — ABNORMAL HIGH (ref 4.5–11.0)
Lab: 32 g/dL — ABNORMAL HIGH (ref 32.0–36.0)
Lab: 4.5 M/UL — ABNORMAL LOW (ref 4.0–5.0)
Lab: 96 FL — ABNORMAL HIGH (ref 80–100)

## 2018-04-28 LAB — POTASSIUM: Lab: 3.3 MMOL/L — ABNORMAL LOW (ref 3.5–5.1)

## 2018-04-28 LAB — BASIC METABOLIC PANEL
Lab: 147 MMOL/L — ABNORMAL LOW (ref >60)
Lab: 3.8 MMOL/L — ABNORMAL HIGH (ref >60)

## 2018-04-28 LAB — MAGNESIUM: Lab: 2.2 mg/dL — ABNORMAL HIGH (ref >60)

## 2018-04-28 LAB — PHOSPHORUS: Lab: 3.6 mg/dL (ref 2.0–4.5)

## 2018-04-28 LAB — IONIZED CALCIUM: Lab: 1 MMOL/L — ABNORMAL LOW (ref 1.0–1.3)

## 2018-04-28 MED ORDER — FENTANYL CITRATE (PF) 50 MCG/ML IJ SOLN
12.5-25 ug | INTRAVENOUS | 0 refills | Status: DC | PRN
Start: 2018-04-28 — End: 2018-04-28

## 2018-04-28 MED ORDER — BISACODYL 10 MG RE SUPP
10 mg | Freq: Every day | RECTAL | 0 refills | Status: CN | PRN
Start: 2018-04-28 — End: ?

## 2018-04-28 MED ORDER — LORAZEPAM 2 MG/ML IJ SOLN
1 mg | Freq: Once | INTRAVENOUS | 0 refills | Status: CP
Start: 2018-04-28 — End: ?
  Administered 2018-04-28: 21:00:00 1 mg via INTRAVENOUS

## 2018-04-28 MED ORDER — FENTANYL CITRATE (PF) 50 MCG/ML IJ SOLN
12.5-25 ug | INTRAVENOUS | 0 refills | 4.00000 days | Status: AC | PRN
Start: 2018-04-28 — End: ?

## 2018-05-12 IMAGING — CR CHEST
2 series · 2 of 2 positions shown · non-contrast
Comparison: none

[chest pa x-wise]
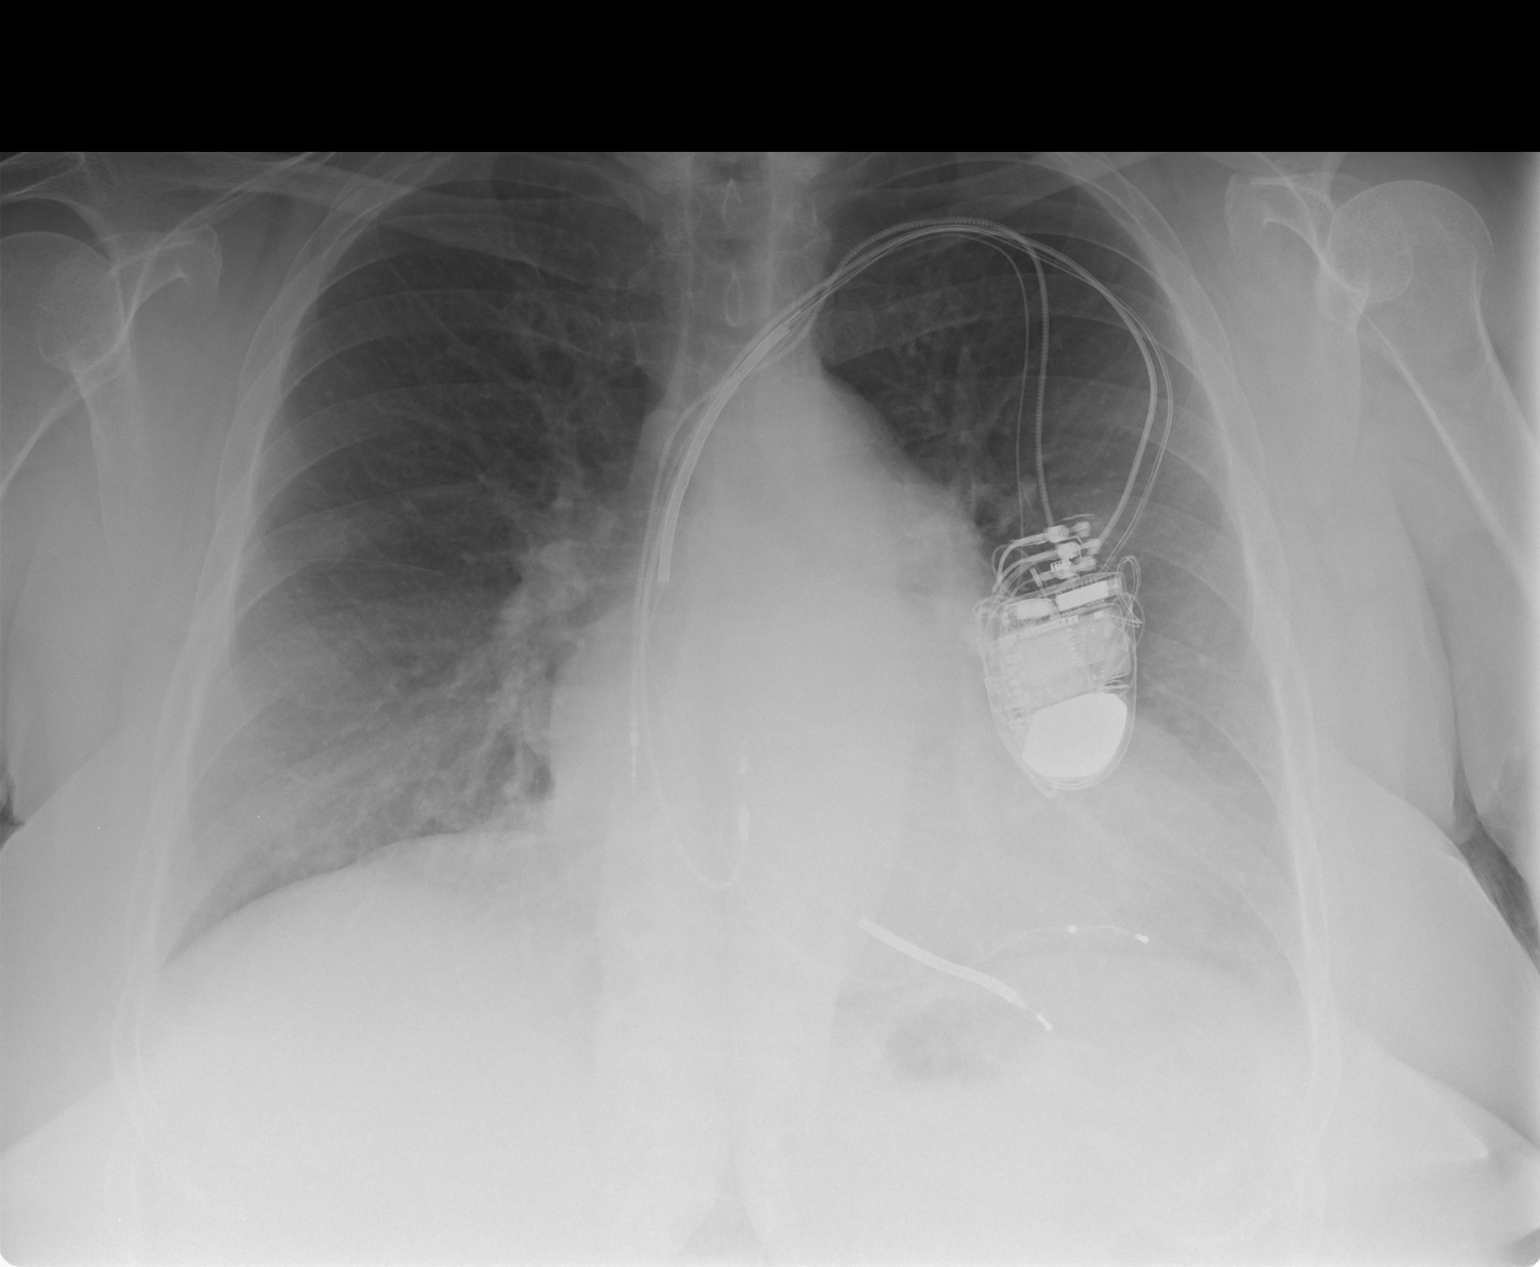

[chest lat]
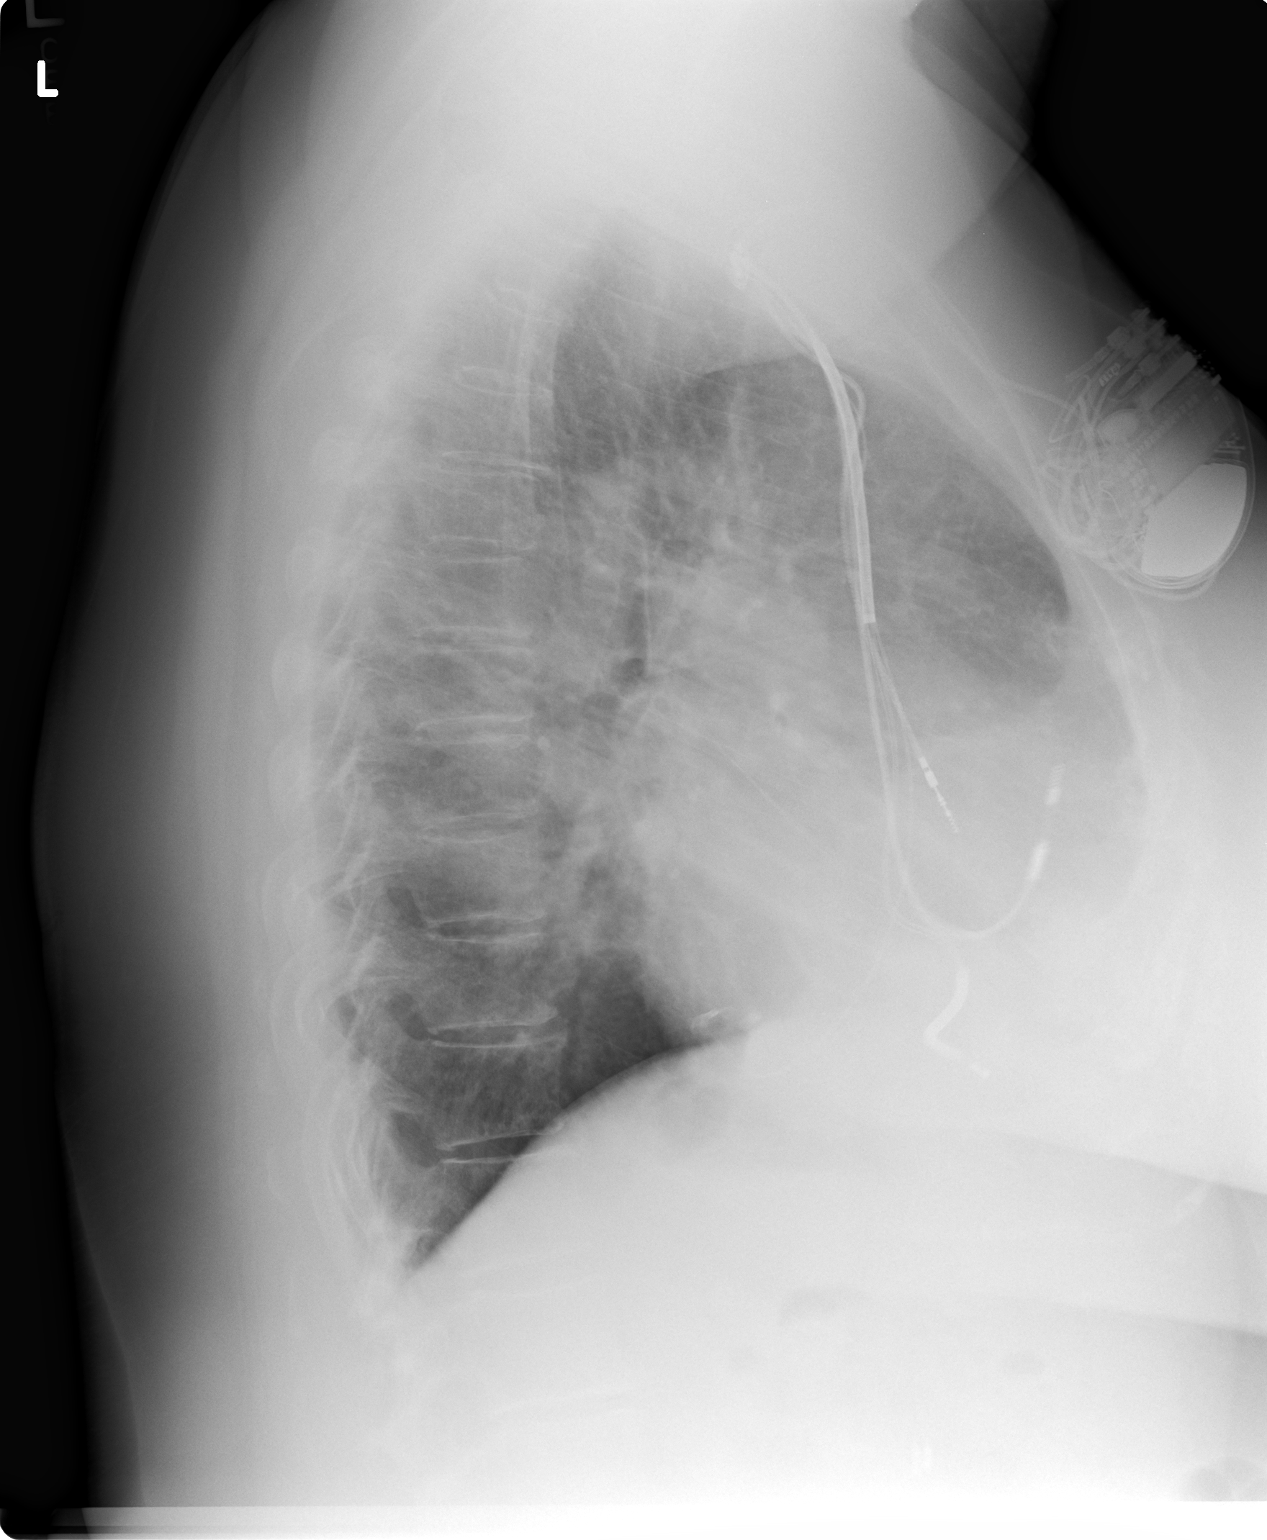

[2 of 2 positions shown; findings below may reference images not displayed]

DIAGNOSTIC STUDIES

EXAM

PA and lateral chest views.

INDICATION

CHEST PAIN; CARDIOMYOPATHY
CHEST PAIN; CARDIOMYOPATHY

TECHNIQUE

Two views of the chest.

COMPARISONS

August 26, 2016.

FINDINGS

Multi lead cardiac pacemaker/AICD. There is mild cardiomegaly. No consolidation, effusion, or
pneumothorax. A nodular opacity in the right mid lung is decreased in conspicuity from prior study.
Surgical clips in the upper abdomen are incidentally noted.

IMPRESSION

Mild enlargement of the cardiac shadow. No acute pulmonary pathology.

Tech Notes:

CHEST PAIN; CARDIOMYOPATHY

## 2018-05-15 DEATH — deceased

## 2019-01-20 IMAGING — CR CHEST
1 series · 1 of 1 positions shown · non-contrast
Comparison: none

[chest port x-wise]
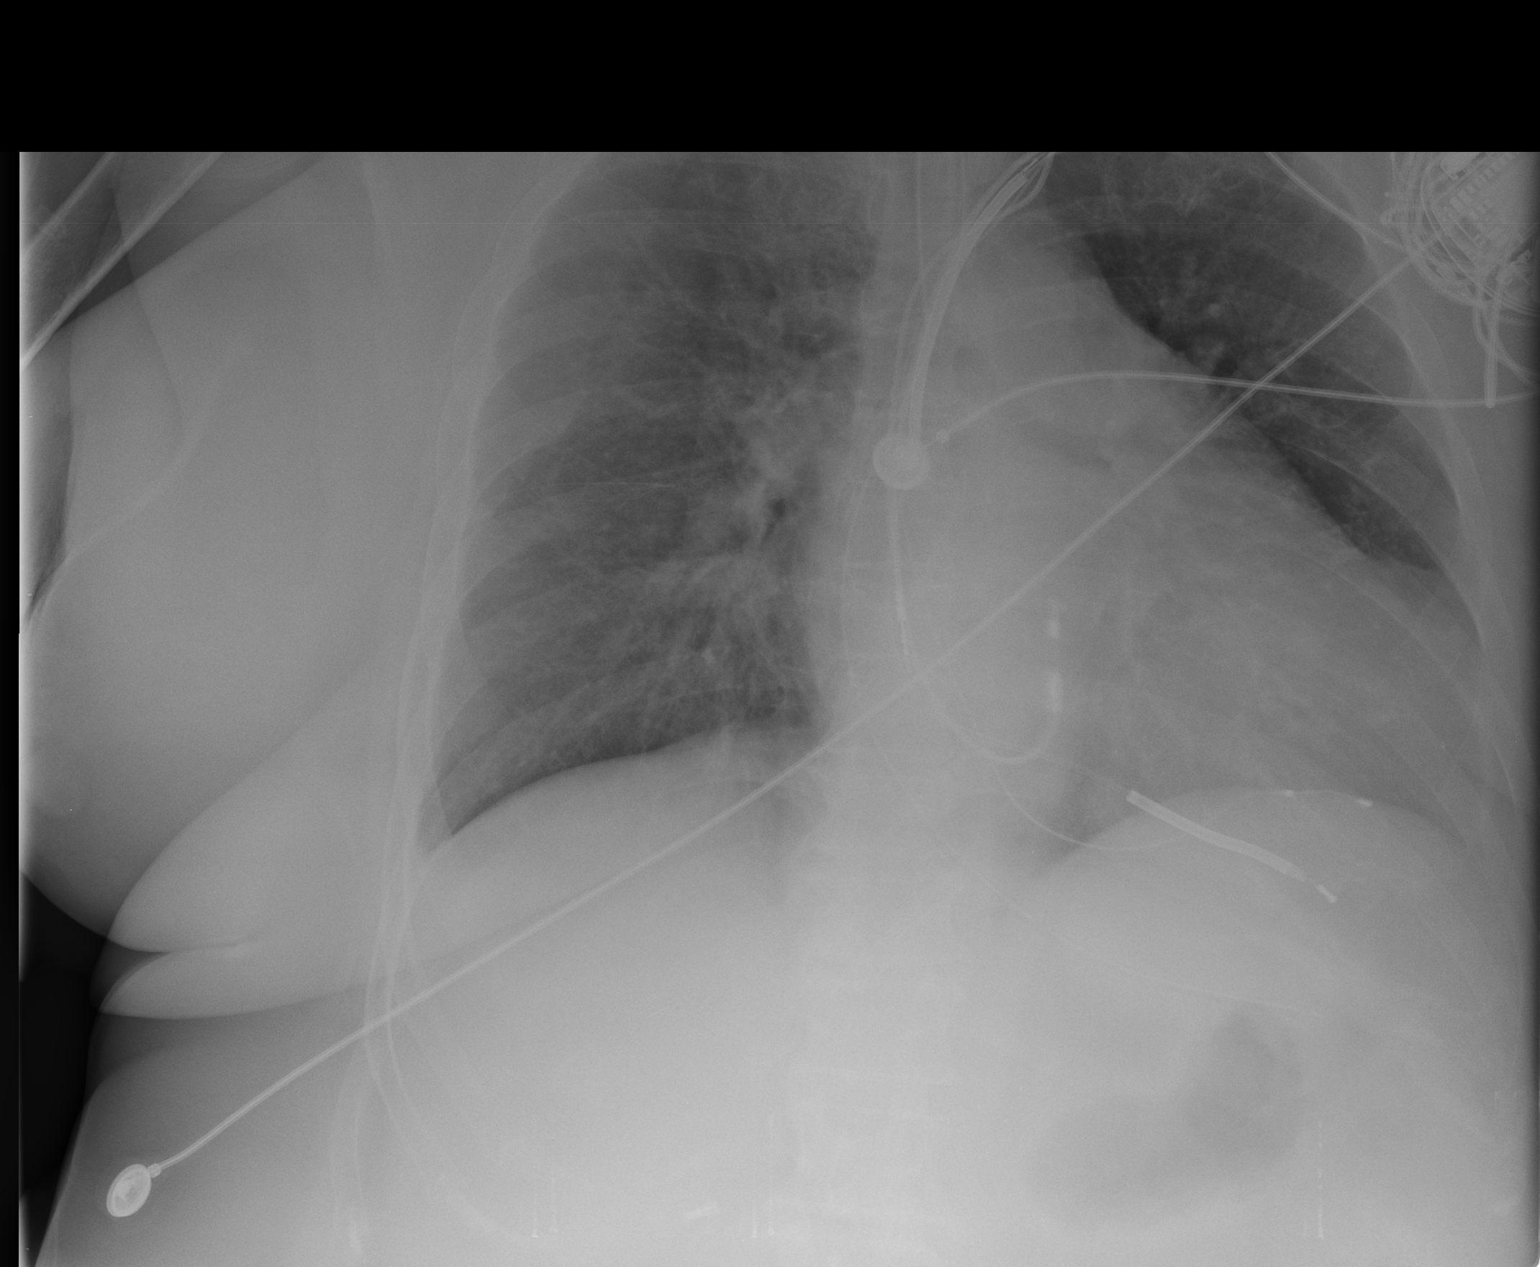

[1 of 1 positions shown; findings below may reference images not displayed]

EXAM
RADIOLOGICAL EXAMINATION, CHEST; SINGLE VIEW, FRONTAL CPT 18080

INDICATION
RIGHT SIDE WEAKNESS. PT HAS ET TUBE AND OG TUBE PLACED. PER NURSE LEADS NOT TO BE REMOVED. HB

TECHNIQUE
1 view of the chest was acquired.

COMPARISONS
Previous examination dated 09/16/2017.

FINDINGS
The cardiac silhouette is within normal limits. Endotracheal and orogastric tubes are adequately
position. Triple lead pacer/defibrillator is identified with a battery pack in the left hemithorax.
The lungs are free of focal consolidations. The bony structures are adequately mineralized.

IMPRESSION
Satisfactory positioning of the endotracheal and orogastric tube.

Tech Notes:

RT SIDE WEAKNESS. PT HAS ET TUBE AND OG TUBE PLACED. PER NURSE LEADS NOT TO BE REMOVED. HB

## 2021-11-04 ENCOUNTER — Encounter: Admit: 2021-11-04 | Discharge: 2021-11-04 | Payer: BC Managed Care – HMO
